# Patient Record
Sex: Female | Born: 1935 | Race: Black or African American | Hispanic: No | Smoking: Never smoker
Health system: Southern US, Community
[De-identification: ages and names within clinical notes are randomized; demographics above are authoritative.]

## PROBLEM LIST (undated history)

## (undated) DIAGNOSIS — I10 Essential (primary) hypertension: Secondary | ICD-10-CM

---

## 2004-11-10 ENCOUNTER — Ambulatory Visit: Payer: Self-pay | Admitting: Family Medicine

## 2005-02-11 ENCOUNTER — Encounter: Payer: Self-pay | Admitting: Physical Medicine & Rehabilitation

## 2005-03-03 ENCOUNTER — Encounter: Payer: Self-pay | Admitting: Physical Medicine & Rehabilitation

## 2005-03-31 ENCOUNTER — Encounter: Payer: Self-pay | Admitting: Physical Medicine & Rehabilitation

## 2006-04-01 ENCOUNTER — Ambulatory Visit: Payer: Self-pay | Admitting: Internal Medicine

## 2006-07-11 ENCOUNTER — Emergency Department: Payer: Self-pay | Admitting: Emergency Medicine

## 2006-07-11 ENCOUNTER — Other Ambulatory Visit: Payer: Self-pay

## 2006-07-20 ENCOUNTER — Ambulatory Visit: Payer: Self-pay | Admitting: Vascular Surgery

## 2006-12-29 ENCOUNTER — Ambulatory Visit: Payer: Self-pay | Admitting: Pain Medicine

## 2007-01-05 ENCOUNTER — Ambulatory Visit: Payer: Self-pay | Admitting: Pain Medicine

## 2007-09-07 ENCOUNTER — Ambulatory Visit: Payer: Self-pay | Admitting: Gastroenterology

## 2007-12-09 ENCOUNTER — Other Ambulatory Visit: Payer: Self-pay

## 2007-12-09 ENCOUNTER — Emergency Department: Payer: Self-pay | Admitting: Emergency Medicine

## 2008-02-20 ENCOUNTER — Other Ambulatory Visit: Payer: Self-pay

## 2008-02-20 ENCOUNTER — Emergency Department: Payer: Self-pay | Admitting: Emergency Medicine

## 2008-02-21 ENCOUNTER — Ambulatory Visit: Payer: Self-pay | Admitting: Family Medicine

## 2008-04-11 ENCOUNTER — Ambulatory Visit: Payer: Self-pay | Admitting: Pain Medicine

## 2008-05-01 ENCOUNTER — Other Ambulatory Visit: Payer: Self-pay

## 2008-05-01 ENCOUNTER — Ambulatory Visit: Payer: Self-pay | Admitting: Family Medicine

## 2008-05-02 ENCOUNTER — Ambulatory Visit: Payer: Self-pay | Admitting: Family Medicine

## 2008-09-04 ENCOUNTER — Ambulatory Visit: Payer: Self-pay | Admitting: Obstetrics and Gynecology

## 2009-04-23 ENCOUNTER — Ambulatory Visit: Payer: Self-pay | Admitting: Orthopedic Surgery

## 2009-10-04 ENCOUNTER — Emergency Department: Payer: Self-pay | Admitting: Emergency Medicine

## 2013-02-23 ENCOUNTER — Encounter: Payer: Self-pay | Admitting: Family Medicine

## 2013-03-03 ENCOUNTER — Encounter: Payer: Self-pay | Admitting: Family Medicine

## 2013-04-03 ENCOUNTER — Encounter: Payer: Self-pay | Admitting: Family Medicine

## 2014-03-09 ENCOUNTER — Emergency Department: Payer: Self-pay | Admitting: Emergency Medicine

## 2014-03-09 LAB — CBC WITH DIFFERENTIAL/PLATELET
Basophil #: 0.1 10*3/uL (ref 0.0–0.1)
Basophil %: 0.8 %
Eosinophil #: 0.2 10*3/uL (ref 0.0–0.7)
Eosinophil %: 1.6 %
HCT: 35.2 % (ref 35.0–47.0)
HGB: 11.8 g/dL — ABNORMAL LOW (ref 12.0–16.0)
LYMPHS ABS: 0.4 10*3/uL — AB (ref 1.0–3.6)
Lymphocyte %: 2.6 %
MCH: 26.2 pg (ref 26.0–34.0)
MCHC: 33.6 g/dL (ref 32.0–36.0)
MCV: 78 fL — ABNORMAL LOW (ref 80–100)
Monocyte #: 0.9 x10 3/mm (ref 0.2–0.9)
Monocyte %: 6.2 %
NEUTROS ABS: 12.9 10*3/uL — AB (ref 1.4–6.5)
Neutrophil %: 88.8 %
Platelet: 201 10*3/uL (ref 150–440)
RBC: 4.5 10*6/uL (ref 3.80–5.20)
RDW: 15.6 % — ABNORMAL HIGH (ref 11.5–14.5)
WBC: 14.5 10*3/uL — ABNORMAL HIGH (ref 3.6–11.0)

## 2014-03-09 LAB — URINALYSIS, COMPLETE
BACTERIA: NONE SEEN
Glucose,UR: NEGATIVE mg/dL (ref 0–75)
Hyaline Cast: 9
KETONE: NEGATIVE
Leukocyte Esterase: NEGATIVE
Nitrite: NEGATIVE
PH: 5 (ref 4.5–8.0)
Protein: 100
SPECIFIC GRAVITY: 1.023 (ref 1.003–1.030)
Squamous Epithelial: 4
WBC UR: 5 /HPF (ref 0–5)

## 2014-03-09 LAB — BASIC METABOLIC PANEL
Anion Gap: 9 (ref 7–16)
BUN: 26 mg/dL — AB (ref 7–18)
CALCIUM: 8.8 mg/dL (ref 8.5–10.1)
Chloride: 94 mmol/L — ABNORMAL LOW (ref 98–107)
Co2: 28 mmol/L (ref 21–32)
Creatinine: 1.17 mg/dL (ref 0.60–1.30)
EGFR (African American): 52 — ABNORMAL LOW
EGFR (Non-African Amer.): 45 — ABNORMAL LOW
Glucose: 118 mg/dL — ABNORMAL HIGH (ref 65–99)
OSMOLALITY: 269 (ref 275–301)
Potassium: 3.3 mmol/L — ABNORMAL LOW (ref 3.5–5.1)
Sodium: 131 mmol/L — ABNORMAL LOW (ref 136–145)

## 2014-03-09 LAB — PROTIME-INR
INR: 7.2
Prothrombin Time: 59.3 secs — ABNORMAL HIGH (ref 11.5–14.7)

## 2014-03-13 ENCOUNTER — Inpatient Hospital Stay: Payer: Self-pay | Admitting: Specialist

## 2014-03-13 LAB — BASIC METABOLIC PANEL
ANION GAP: 9 (ref 7–16)
BUN: 21 mg/dL — AB (ref 7–18)
Calcium, Total: 8.5 mg/dL (ref 8.5–10.1)
Chloride: 97 mmol/L — ABNORMAL LOW (ref 98–107)
Co2: 28 mmol/L (ref 21–32)
Creatinine: 0.8 mg/dL (ref 0.60–1.30)
EGFR (Non-African Amer.): >60
Glucose: 109 mg/dL — ABNORMAL HIGH (ref 65–99)
Osmolality: 272 (ref 275–301)
POTASSIUM: 3.5 mmol/L (ref 3.5–5.1)
Sodium: 134 mmol/L — ABNORMAL LOW (ref 136–145)

## 2014-03-13 LAB — CBC WITH DIFFERENTIAL/PLATELET
BASOS ABS: 0.1 10*3/uL (ref 0.0–0.1)
Basophil %: 0.3 %
EOS ABS: 0 10*3/uL (ref 0.0–0.7)
Eosinophil %: 0.2 %
HCT: 30.9 % — ABNORMAL LOW (ref 35.0–47.0)
HGB: 10 g/dL — ABNORMAL LOW (ref 12.0–16.0)
Lymphocyte #: 2.1 10*3/uL (ref 1.0–3.6)
Lymphocyte %: 8.5 %
MCH: 25.3 pg — ABNORMAL LOW (ref 26.0–34.0)
MCHC: 32.3 g/dL (ref 32.0–36.0)
MCV: 78 fL — AB (ref 80–100)
MONO ABS: 1.3 x10 3/mm — AB (ref 0.2–0.9)
Monocyte %: 5.3 %
NEUTROS ABS: 20.7 10*3/uL — AB (ref 1.4–6.5)
Neutrophil %: 85.7 %
Platelet: 272 10*3/uL (ref 150–440)
RBC: 3.96 10*6/uL (ref 3.80–5.20)
RDW: 15.6 % — ABNORMAL HIGH (ref 11.5–14.5)
WBC: 24.1 10*3/uL — ABNORMAL HIGH (ref 3.6–11.0)

## 2014-03-13 LAB — PROTIME-INR
INR: 5.9
PROTHROMBIN TIME: 51 s — AB (ref 11.5–14.7)

## 2014-03-13 LAB — HEPATIC FUNCTION PANEL A (ARMC)
ALBUMIN: 1.9 g/dL — AB (ref 3.4–5.0)
ALK PHOS: 402 U/L — AB
AST: 46 U/L — AB (ref 15–37)
BILIRUBIN DIRECT: 1.9 mg/dL — AB (ref 0.00–0.20)
Bilirubin,Total: 2.9 mg/dL — ABNORMAL HIGH (ref 0.2–1.0)
SGPT (ALT): 38 U/L
TOTAL PROTEIN: 7.5 g/dL (ref 6.4–8.2)

## 2014-03-13 LAB — URINALYSIS, COMPLETE
BILIRUBIN, UR: NEGATIVE
BLOOD: NEGATIVE
Glucose,UR: NEGATIVE mg/dL (ref 0–75)
Ketone: NEGATIVE
Leukocyte Esterase: NEGATIVE
NITRITE: NEGATIVE
PH: 6 (ref 4.5–8.0)
Protein: NEGATIVE
SPECIFIC GRAVITY: 1.017 (ref 1.003–1.030)
WBC UR: 3 /HPF (ref 0–5)

## 2014-03-13 LAB — TROPONIN I: Troponin-I: <0.02 ng/mL

## 2014-03-13 LAB — APTT: Activated PTT: 92.3 secs — ABNORMAL HIGH (ref 23.6–35.9)

## 2014-03-14 LAB — CBC WITH DIFFERENTIAL/PLATELET
BASOS PCT: 0.2 %
Basophil #: 0 10*3/uL (ref 0.0–0.1)
EOS ABS: 0.1 10*3/uL (ref 0.0–0.7)
EOS PCT: 0.5 %
HCT: 29.2 % — ABNORMAL LOW (ref 35.0–47.0)
HGB: 9.3 g/dL — ABNORMAL LOW (ref 12.0–16.0)
Lymphocyte #: 1.3 10*3/uL (ref 1.0–3.6)
Lymphocyte %: 7.1 %
MCH: 24.9 pg — ABNORMAL LOW (ref 26.0–34.0)
MCHC: 31.8 g/dL — AB (ref 32.0–36.0)
MCV: 78 fL — ABNORMAL LOW (ref 80–100)
MONO ABS: 1.2 x10 3/mm — AB (ref 0.2–0.9)
Monocyte %: 6.7 %
Neutrophil #: 15.2 10*3/uL — ABNORMAL HIGH (ref 1.4–6.5)
Neutrophil %: 85.5 %
PLATELETS: 285 10*3/uL (ref 150–440)
RBC: 3.74 10*6/uL — ABNORMAL LOW (ref 3.80–5.20)
RDW: 15.7 % — ABNORMAL HIGH (ref 11.5–14.5)
WBC: 17.7 10*3/uL — ABNORMAL HIGH (ref 3.6–11.0)

## 2014-03-14 LAB — COMPREHENSIVE METABOLIC PANEL
ALBUMIN: 1.7 g/dL — AB (ref 3.4–5.0)
Alkaline Phosphatase: 324 U/L — ABNORMAL HIGH
Anion Gap: 6 — ABNORMAL LOW (ref 7–16)
BILIRUBIN TOTAL: 2.2 mg/dL — AB (ref 0.2–1.0)
BUN: 16 mg/dL (ref 7–18)
CHLORIDE: 102 mmol/L (ref 98–107)
Calcium, Total: 8 mg/dL — ABNORMAL LOW (ref 8.5–10.1)
Co2: 28 mmol/L (ref 21–32)
Creatinine: 0.88 mg/dL (ref 0.60–1.30)
EGFR (African American): >60
Glucose: 99 mg/dL (ref 65–99)
Osmolality: 273 (ref 275–301)
POTASSIUM: 3 mmol/L — AB (ref 3.5–5.1)
SGOT(AST): 30 U/L (ref 15–37)
SGPT (ALT): 28 U/L
Sodium: 136 mmol/L (ref 136–145)
Total Protein: 6.5 g/dL (ref 6.4–8.2)

## 2014-03-14 LAB — LACTATE DEHYDROGENASE: LDH: 170 U/L (ref 81–246)

## 2014-03-14 LAB — PROTIME-INR
INR: 5.7
Prothrombin Time: 49.3 secs — ABNORMAL HIGH (ref 11.5–14.7)

## 2014-03-15 LAB — URINE CULTURE

## 2014-03-15 LAB — POTASSIUM: Potassium: 2.6 mmol/L — ABNORMAL LOW (ref 3.5–5.1)

## 2014-03-15 LAB — PROTIME-INR
INR: 2.3
PROTHROMBIN TIME: 24.4 s — AB (ref 11.5–14.7)

## 2014-03-16 LAB — PROTIME-INR
INR: 1.4
Prothrombin Time: 17.2 secs — ABNORMAL HIGH (ref 11.5–14.7)

## 2014-03-16 LAB — POTASSIUM: Potassium: 3.2 mmol/L — ABNORMAL LOW (ref 3.5–5.1)

## 2014-03-17 LAB — CBC WITH DIFFERENTIAL/PLATELET
BASOS ABS: 0.1 10*3/uL (ref 0.0–0.1)
BASOS PCT: 0.9 %
EOS ABS: 0.2 10*3/uL (ref 0.0–0.7)
EOS PCT: 2.4 %
HCT: 26.5 % — ABNORMAL LOW (ref 35.0–47.0)
HGB: 8.7 g/dL — ABNORMAL LOW (ref 12.0–16.0)
LYMPHS ABS: 1.1 10*3/uL (ref 1.0–3.6)
Lymphocyte %: 14.3 %
MCH: 25.7 pg — AB (ref 26.0–34.0)
MCHC: 33.1 g/dL (ref 32.0–36.0)
MCV: 78 fL — ABNORMAL LOW (ref 80–100)
Monocyte #: 0.7 x10 3/mm (ref 0.2–0.9)
Monocyte %: 9.7 %
NEUTROS ABS: 5.4 10*3/uL (ref 1.4–6.5)
Neutrophil %: 72.7 %
PLATELETS: 293 10*3/uL (ref 150–440)
RBC: 3.4 10*6/uL — AB (ref 3.80–5.20)
RDW: 15.9 % — ABNORMAL HIGH (ref 11.5–14.5)
WBC: 7.4 10*3/uL (ref 3.6–11.0)

## 2014-03-17 LAB — LIPASE, BLOOD: LIPASE: 4326 U/L — AB (ref 73–393)

## 2014-03-17 LAB — COMPREHENSIVE METABOLIC PANEL
ALT: 21 U/L
AST: 24 U/L (ref 15–37)
Albumin: 1.6 g/dL — ABNORMAL LOW (ref 3.4–5.0)
Alkaline Phosphatase: 234 U/L — ABNORMAL HIGH
Anion Gap: 8 (ref 7–16)
BILIRUBIN TOTAL: 1.4 mg/dL — AB (ref 0.2–1.0)
BUN: 4 mg/dL — ABNORMAL LOW (ref 7–18)
CALCIUM: 7.7 mg/dL — AB (ref 8.5–10.1)
CHLORIDE: 110 mmol/L — AB (ref 98–107)
Co2: 25 mmol/L (ref 21–32)
Creatinine: 0.82 mg/dL (ref 0.60–1.30)
EGFR (Non-African Amer.): >60
GLUCOSE: 106 mg/dL — AB (ref 65–99)
Osmolality: 282 (ref 275–301)
POTASSIUM: 3.5 mmol/L (ref 3.5–5.1)
Sodium: 143 mmol/L (ref 136–145)
Total Protein: 6.2 g/dL — ABNORMAL LOW (ref 6.4–8.2)

## 2014-03-18 LAB — COMPREHENSIVE METABOLIC PANEL
ALK PHOS: 212 U/L — AB
ANION GAP: 8 (ref 7–16)
AST: 23 U/L (ref 15–37)
Albumin: 1.8 g/dL — ABNORMAL LOW (ref 3.4–5.0)
BUN: 5 mg/dL — AB (ref 7–18)
Bilirubin,Total: 1.2 mg/dL — ABNORMAL HIGH (ref 0.2–1.0)
CHLORIDE: 109 mmol/L — AB (ref 98–107)
CO2: 25 mmol/L (ref 21–32)
Calcium, Total: 7.7 mg/dL — ABNORMAL LOW (ref 8.5–10.1)
Creatinine: 0.84 mg/dL (ref 0.60–1.30)
EGFR (African American): >60
GLUCOSE: 102 mg/dL — AB (ref 65–99)
Osmolality: 281 (ref 275–301)
Potassium: 3.6 mmol/L (ref 3.5–5.1)
SGPT (ALT): 18 U/L
SODIUM: 142 mmol/L (ref 136–145)
Total Protein: 6.4 g/dL (ref 6.4–8.2)

## 2014-03-18 LAB — CULTURE, BLOOD (SINGLE)

## 2014-03-18 LAB — LIPASE, BLOOD: LIPASE: 728 U/L — AB (ref 73–393)

## 2014-03-21 LAB — PATHOLOGY REPORT

## 2014-05-08 ENCOUNTER — Ambulatory Visit: Payer: Self-pay | Admitting: Gastroenterology

## 2014-05-23 ENCOUNTER — Ambulatory Visit: Payer: Self-pay | Admitting: Oncology

## 2014-06-06 ENCOUNTER — Ambulatory Visit: Payer: Self-pay | Admitting: Oncology

## 2014-07-02 ENCOUNTER — Ambulatory Visit: Payer: Self-pay | Admitting: Ophthalmology

## 2014-07-03 ENCOUNTER — Ambulatory Visit: Payer: Self-pay | Admitting: Oncology

## 2014-08-27 ENCOUNTER — Ambulatory Visit: Payer: Self-pay | Admitting: Ophthalmology

## 2014-11-24 NOTE — Op Note (Signed)
PATIENT NAME:  Toni Perry, Toni Perry MR#:  [id number] DATE OF BIRTH:  08-23-1930  DATE OF PROCEDURE:  03/18/2014  PREOPERATIVE DIAGNOSIS: Choledocholithiasis.   POSTOPERATIVE DIAGNOSIS: Choledocholithiasis.   PROCEDURE PERFORMED: Laparoscopic cholecystectomy.  ANESTHESIA: General endotracheal.  ESTIMATED BLOOD LOSS: 25 mL.   COMPLICATIONS: None.   SPECIMENS: Gallbladder.   INDICATION FOR SURGERY: Toni Perry is a pleasant 81 year old female with history of choledocholithiasis status post ERCP, stone extraction and stent placement. She was brought to the operating room for prophylactic cholecystectomy.   DETAILS OF PROCEDURE: Informed consent was obtained. Toni Perry was brought to the operating room suite. She was induced. Endotracheal tube was placed. General anesthesia was administered. The abdomen was prepped and draped in standard surgical fashion. A timeout was then performed correctly identifying the patient name, operative site and procedure to be performed. A supraumbilical incision was made. It was deepened down to the fascia. The fascia was incised. The peritoneum was entered. Two stay sutures were placed through the fasciotomy. Hassan trocar was placed in the abdomen. The abdomen was insufflated. The gallbladder was visualized. It was quite large. I then placed an 11 meter epigastric and 2 right subcostal 5 mm trocars.   The gallbladder was then lifted up. The cystic duct and was dissected out. Critical view was obtained. There were small branches that led to the cystic artery, but no obvious cystic artery was identified. The cystic duct was large and ligated with a firing of an Endo GIA stapler. The gallbladder was then taken off the gallbladder fossa with hook cautery. There were small branches which were feeding the blood supply of a very large gallbladder. These were clipped individually as they were encountered. The gallbladder was then taken off the gallbladder fossa with hook  cautery, brought out through an Endo Catch bag. The gallbladder fossa was then examined and made hemostatic. The abdomen was irrigated. The gallbladder fossa was hemostatic. The abdomen was then desufflated all trocars were taken out under direct visualization. The supraumbilical trocar site fascia was closed with a figure-of-eight 0 Vicryl; 4-0 Monocryl deep dermal sutures were used to close the skin. Steri-Strips, Telfa gauze and Tegaderm were used to complete the dressing.   The patient was then awoken, extubated and brought to the postanesthesia care unit. There were no immediate complications. Needle, sponge, and instrument counts were correct at the end of the procedure.    ____________________________ Si Raiderhristopher A. Jaylnn Ullery, MD cal:JT D: 03/18/2014 10:04:35 ET T: 03/18/2014 12:30:13 ET JOB#: [id number]  cc: Cristal Deerhristopher A. Dekayla Prestridge, MD, <Dictator> Jarvis NewcomerHRISTOPHER A Bertha Earwood MD ELECTRONICALLY SIGNED 03/26/2014 21:09

## 2014-11-24 NOTE — Consult Note (Signed)
Discussed ERCP with patient, will need GB out at some time.  Dr. Servando SnareWohl NP note noted.  Hopefully on for tomorrow if coagulation improved.  Electronic Signatures: Scot JunElliott, Robert T (MD)  (Signed on 13-Aug-15 16:52)  Authored  Last Updated: 13-Aug-15 16:52 by Scot JunElliott, Robert T (MD)

## 2014-11-24 NOTE — H&P (Signed)
PATIENT NAME:  Toni Perry, Toni Perry MR#:  [id number] DATE OF BIRTH:  1931/05/13  DATE OF ADMISSION:  03/13/2014  PRIMARY CARE PHYSICIAN: Dione Housekeeper, MD  CHIEF COMPLAINT: "My Coumadin level is going up."   HISTORY OF PRESENT ILLNESS: This is an 81 year old female who was recently in the ER for elevated Coumadin level, coming back in today for some abdominal pain and elevated Coumadin level. She complains of abdominal pain, light across the entire upper abdomen. No other radiation, not that severe in nature. Currently, she does not have any pain. In the ER, she was found to have an elevated white count of 24,000. Chest x-ray and urinalysis were negative. An ultrasound of the abdomen showed cholelithiasis and gallbladder sludge. No evidence of acute cholecystitis, biliary ductal dilation with sludge or stones within the common duct, consider ERCP. The patient's INR is up at 5.9, total bilirubin 2.9, direct bilirubin 1.9, alkaline phosphatase 402, ALT 38 and AST 46 in the ER. The patient is a poor historian, unable to give much history. Hospitalist services were contacted for further evaluation.   PAST MEDICAL HISTORY: PE, DVT, on Coumadin, gallstones, depression, gastroesophageal reflux disease and hypertension.   PAST SURGICAL HISTORY: Hip replacement, laminectomy, tonsillectomy.   ALLERGIES: No known drug allergies.   MEDICATIONS: As per prescription writer include aspirin 81 mg daily, calcium with vitamin D daily, Coumadin 1 mg has 4 mg and 5 mg alternating, felodipine 2.5 mg daily, fish oil 1000 mg 3 times a day, folic acid 800 mcg daily, hydrochlorothiazide 25 mg daily, Lexapro 20 mg daily, Lipitor 20 mg daily, metoprolol 12.5 mg twice a day, multivitamin 1 tablet daily, potassium chloride 20 mEq extended-release daily, Prevacid 30 mg daily, quinapril 20 mg daily, vitamin C 500 mg daily, vitamin D 400 international units daily.   SOCIAL HISTORY: No smoking. No alcohol. No drug use. Lives  alone. Used to be a Engineer, civil (consulting).   FAMILY HISTORY: Father: Unknown medical history. Mother died of some heart-related issue when the patient was 76 months old.   REVIEW OF SYSTEMS: CONSTITUTIONAL: No fever, chills or sweats. Positive for weight loss. Positive for weakness.  EYES: She does have cataracts and some poor vision.  EARS, NOSE, MOUTH AND THROAT: No hearing loss. No sore throat. Positive for sinus congestion.  CARDIOVASCULAR: No chest pain. No palpitations.  RESPIRATORY: No shortness of breath. Positive for cough. No sputum. No hemoptysis.  GASTROINTESTINAL: Positive for abdominal pain. No nausea. No vomiting. No diarrhea. No constipation. No bright red blood per rectum. No melena.  GENITOURINARY: No burning on urination. No hematuria.  MUSCULOSKELETAL: No joint pain or muscle pain.  INTEGUMENT: Easy bruising.  NEUROLOGIC: No fainting or blackouts.  PSYCHIATRIC: On medication for depression.  ENDOCRINE: No thyroid problems.  HEMATOLOGIC AND LYMPHATIC: History of anemia.   PHYSICAL EXAMINATION:  VITAL SIGNS: On presentation to the Emergency Room included a temperature of 98.5, pulse 57, respirations 20, blood pressure 124/67, pulse oximetry 97% on room air.  GENERAL: No respiratory distress.  EYES: Conjunctivae and lids normal. Pupils equal, round and reactive to light. Extraocular muscles intact. No nystagmus.  EARS, NOSE, MOUTH AND THROAT: Tympanic membranes: No erythema. Nasal mucosa: No erythema. Throat: No erythema, no exudate seen. Lips and gums: No lesions.  NECK: No JVD. No bruits. No lymphadenopathy. No thyromegaly. No thyroid nodules palpated.  LUNGS: Clear to auscultation. No use of accessory muscles to breathe. No rhonchi, rales or wheeze heard.  CARDIOVASCULAR: S1, S2 normal. No gallops, rubs or murmurs  heard. Carotid upstroke 2+ bilaterally. No bruits. Dorsalis pedis pulses 1+ bilaterally. Trace edema of the lower extremity.  ABDOMEN: Soft. Slight discomfort over the upper  abdomen. No organomegaly/splenomegaly. Normoactive bowel sounds. No masses felt.  LYMPHATIC: No lymph nodes in the neck.  MUSCULOSKELETAL: Trace edema. No clubbing. No cyanosis.  SKIN: No ulcers or lesions seen.  NEUROLOGIC: Cranial nerves II-XII grossly intact. Deep tendon reflexes 1+ bilateral lower extremity.  PSYCHIATRIC: The patient is alert, oriented to person and place.   LABORATORY AND RADIOLOGICAL DATA: Ultrasound of the abdomen shows cholelithiasis and gallbladder sludge. No evidence of acute cholecystitis. Biliary ductal dilation with sludge or stones within the common duct, consider ERCP. Urinalysis negative.  Chest x-ray: No active disease. White blood cell count 24.1, H and H 10.0 and 30.9, platelet count of 272. Glucose 109, BUN 21, creatinine 0.8, sodium 134, potassium 3.5, chloride 97, CO2 of 28, calcium 8.5. Troponin negative. INR 5.9, total bilirubin 2.9, direct 1.9, alkaline phosphatase 402, ALT 38, AST 46, albumin low at 1.9. EKG: Sinus bradycardia, 57 beats per minute.   ASSESSMENT AND PLAN:  1.  Possible cholangitis with leukocytosis, dilated biliary ducts with possible sludge or stones. Since the patient's INR is elevated, unable to do an ERCP at this time. We will order an MRCP and give empiric Zosyn. We will give a GI consultation, serial CMPs. We will give a dose of vitamin K for the coagulopathy.  2.  Coagulopathy with history of pulmonary embolism. We will give a dose of vitamin K. If the patient's Coumadin level bounces around, maybe a candidate for one of the newer agents upon discharge.  3.  Relative hypotension. We will give IV fluid hydration. Hold most of the blood pressure medications at this time except for the metoprolol.  4.  Depression. Continue Lexapro.  5.  Gastroesophageal reflux disease. We will put on Protonix while here.  6.  Malnutrition.  TIME SPENT ON ADMISSION: Fifty-five minutes.   CODE STATUS: The patient is a full code.     ____________________________ Herschell Dimesichard J. Renae GlossWieting, MD rjw:TT D: 03/13/2014 20:31:51 ET T: 03/13/2014 21:08:09 ET JOB#: [id number]  cc: Herschell Dimesichard J. Renae GlossWieting, MD, <Dictator> Dione HousekeeperMario Ernesto Olmedo, MD Salley ScarletICHARD J Roderic Lammert MD ELECTRONICALLY SIGNED 04/06/2014 15:05

## 2014-11-24 NOTE — Discharge Summary (Signed)
PATIENT NAME:  Toni GriceHEWLETT, Toni Perry MR#:  [id number] DATE OF BIRTH:  25-Feb-1931  DATE OF ADMISSION:  03/13/2014 DATE OF DISCHARGE:  03/19/2014  ADDENDUM  DISCHARGE DIAGNOSES: Bacteremia: The patient was noted to have positive blood cultures with E. coli on August 11th. The suspected source is likely a biliary source given her choledocholithiasis. She was empirically treated with IV Zosyn while in the hospital. Her repeat blood cultures have been negative. At this point, she is afebrile and hemodynamically stable. I am discharging her on some oral Ceftin for the negative blood culture for another week or so.  ____________________________ Rolly PancakeVivek Perry. Cherlynn KaiserSainani, MD vjs:sb D: 03/19/2014 16:26:50 ET T: 03/19/2014 16:33:40 ET JOB#: [id number]  cc: Rolly PancakeVivek Perry. Cherlynn KaiserSainani, MD, <Dictator> Houston SirenVIVEK Perry Nashonda Limberg MD ELECTRONICALLY SIGNED 03/20/2014 16:03

## 2014-11-24 NOTE — Consult Note (Signed)
Chief Complaint:  Subjective/Chief Complaint We were contacted to evaluate patient for possible ERCP with stone extraction & sphincterotomy.  Dr Elliott's notes reviewed.  Pt denies any abdominal pain, nausea, or vomiting.   VITAL SIGNS/ANCILLARY NOTES: **Vital Signs.:   13-Aug-15 08:30  Vital Signs Type Q 4hr  Temperature Temperature (F) 97.7  Celsius 36.5  Temperature Source oral  Pulse Pulse 52  Respirations Respirations 18  Systolic BP Systolic BP 403  Diastolic BP (mmHg) Diastolic BP (mmHg) 66  Mean BP 83  Pulse Ox % Pulse Ox % 96  Pulse Ox Activity Level  At rest  Oxygen Delivery Room Air/ 21 %   Brief Assessment:  GEN well developed, well nourished, no acute distress, A/Ox3   Cardiac Regular   Respiratory normal resp effort   Gastrointestinal details normal Soft  Nontender  Nondistended  No masses palpable  Bowel sounds normal  No rebound tenderness  No gaurding  No rigidity   EXTR negative cyanosis/clubbing, negative edema   Additional Physical Exam Skin: warm, dry HEENT: mild icterus   Lab Results: Hepatic:  12-Aug-15 04:49   Bilirubin, Total  2.2  Alkaline Phosphatase  324 (46-116 NOTE: New Reference Range 02/20/14)  SGPT (ALT) 28 (14-63 NOTE: New Reference Range 02/20/14)  SGOT (AST) 30  Total Protein, Serum 6.5  Albumin, Serum  1.7  Routine BB:  12-Aug-15 13:47   Fresh Frozen Plasma Unit 1 Transfused  Result(s) reported on 15 Mar 2014 at 06:56AM.  13-Aug-15 09:40   Fresh Frozen Plasma Unit 1 Ready (Result(s) reported on 15 Mar 2014 at 09:51AM.)  Routine Chem:  12-Aug-15 04:49   LDH, Serum 170 (Result(s) reported on 14 Mar 2014 at 06:07AM.)  Glucose, Serum 99  BUN 16  Creatinine (comp) 0.88  Sodium, Serum 136  Potassium, Serum  3.0  Chloride, Serum 102  CO2, Serum 28  Calcium (Total), Serum  8.0  Osmolality (calc) 273  eGFR (African American) >60  eGFR (Non-African American) >60 (eGFR values <62m/min/1.73 m2 may be an indication of  chronic kidney disease (CKD). Calculated eGFR is useful in patients with stable renal function. The eGFR calculation will not be reliable in acutely ill patients when serum creatinine is changing rapidly. It is not useful in  patients on dialysis. The eGFR calculation may not be applicable to patients at the low and high extremes of body sizes, pregnant women, and vegetarians.)  Anion Gap  6  Result Comment INR - RESULTS VERIFIED BY REPEAT TESTING.  - NOTIFIED OF CRITICAL VALUE  - HP TO DON SONGSEN_0 ,03/14/14  - READ-BACK PROCESS PERFORMED.  Result(s) reported on 14 Mar 2014 at 06:32AM.  Routine Coag:  12-Aug-15 04:49   Prothrombin  49.3  INR  5.7 (INR reference interval applies to patients on anticoagulant therapy. A single INR therapeutic range for coumarins is not optimal for all indications; however, the suggested range for most indications is 2.0 - 3.0. Exceptions to the INR Reference Range may include: Prosthetic heart valves, acute myocardial infarction, prevention of myocardial infarction, and combinations of aspirin and anticoagulant. The need for a higher or lower target INR must be assessed individually. Reference: The Pharmacology and Management of the Vitamin K  antagonists: the seventh ACCP Conference on Antithrombotic and Thrombolytic Therapy. CKVQQV.9563Sept:126 (3suppl): [id number] A HCT value >55% may artifactually increase the PT.  In one study,  the increase was an average of 25%. Reference:  "Effect on Routine and Special Coagulation Testing Values of Citrate Anticoagulant Adjustment in Patients with High  HCT Values." American Journal of Clinical Pathology 1165;790:383-338.)  13-Aug-15 05:32   Prothrombin  24.4  INR 2.3 (INR reference interval applies to patients on anticoagulant therapy. A single INR therapeutic range for coumarins is not optimal for all indications; however, the suggested range for most indications is 2.0 - 3.0. Exceptions to the INR  Reference Range may include: Prosthetic heart valves, acute myocardial infarction, prevention of myocardial infarction, and combinations of aspirin and anticoagulant. The need for a higher or lower target INR must be assessed individually. Reference: The Pharmacology and Management of the Vitamin K  antagonists: the seventh ACCP Conference on Antithrombotic and Thrombolytic Therapy. VANVB.1660 Sept:126 (3suppl): [id number]. A HCT value >55% may artifactually increase the PT.  In one study,  the increase was an average of 25%. Reference:  "Effect on Routine and Special Coagulation Testing Values of Citrate Anticoagulant Adjustment in Patients with High HCT Values." American Journal of Clinical Pathology 2006;126:400-405.)  Routine Hem:  12-Aug-15 04:49   WBC (CBC)  17.7  RBC (CBC)  3.74  Hemoglobin (CBC)  9.3  Hematocrit (CBC)  29.2  Platelet Count (CBC) 285  MCV  78  MCH  24.9  MCHC  31.8  RDW  15.7  Neutrophil % 85.5  Lymphocyte % 7.1  Monocyte % 6.7  Eosinophil % 0.5  Basophil % 0.2  Neutrophil #  15.2  Lymphocyte # 1.3  Monocyte #  1.2  Eosinophil # 0.1  Basophil # 0.0 (Result(s) reported on 14 Mar 2014 at 05:54AM.)   Assessment/Plan:  Assessment/Plan:  Assessment Choledocholithiasis:  Appreciate your referral for ERCP with stone extraction & sphincterotomy with Dr Allen Norris.  Pt has multiple distal choledocholithiasis with diffuse biliary dilation & cholelithiasis.  Discussed risks/benefits of procedure which include but are not limited to pancreatitis, bleeding, infection, perforation & drug reaction.  Patient agrees with this plan & consent will be obtained.  Her INR should be around 1.5 prior to procedure to minimize risk of bleeding.  I discussed with Dr Posey Pronto who has order more FFP & Vit K today with recheck in AM.   Coagulopathy: INR 2.3, per attending as above Hypokalemia: K 3.0, Attending to replete with recheck in AM   Plan 1) Agree with FFP & Vit K 2) Recheck INR &  BMP in AM 3) K repletion per attending 4) Surgery recommends ultimate cholecystectomy 5) NPO after MN 6) ERCP tomorrow with Dr Allen Norris if INR 1.5 or less Please call if you have any questions or concerns   Electronic Signatures: Andria Meuse (NP)  (Signed 13-Aug-15 10:14)  Authored: Chief Complaint, VITAL SIGNS/ANCILLARY NOTES, Brief Assessment, Lab Results, Assessment/Plan   Last Updated: 13-Aug-15 10:14 by Andria Meuse (NP)

## 2014-11-24 NOTE — Consult Note (Signed)
PATIENT NAME:  Toni Perry, Toni Perry MR#:  [id number] DATE OF BIRTH:  [birth year]  DATE OF CONSULTATION:  03/14/2014  REFERRING PHYSICIAN:   CONSULTING PHYSICIAN:  Cristal Deerhristopher A. Damonte Frieson, MD  REASON FOR CONSULTATION: Abdominal pain, leukocytosis, elevated bilirubin and common bile duct dilatation and stones.   HISTORY OF PRESENT ILLNESS: Toni Perry is a pleasant 81 year old female who presented initially for elevated INR who came in with abdominal pain. She says that her pain is across her upper abdomen. This has been going on now for approximately 3 months, does not radiate, not severe. She is currently without pain and was without pain at presentation. She was noted to have an elevated white cell count and ultrasound showed cholelithiasis and sludge. Her INR was noted to be 5.9, her total bilirubin was 2.9 and direct bilirubin was 1.9, her alkaline phosphatase was 402. She had an MRCP today which showed a dilated common bile duct with multiple common bile duct stones. She is to undergo an ERCP. Otherwise, no fevers, chills, night sweats, shortness of breath, cough, chest pain, nausea, vomiting, diarrhea, constipation, dysuria or hematuria.   PAST MEDICAL HISTORY:  1.  History of PE and DVT, on Coumadin.  2.  History of gallstones, history of common bile duct dilatation, status post ERCP in 2009.  3.  History of depression. 4.  History of GERD. 5.  History of hypertension. 6.  History of hip replacement. 7.  History of lumpectomy.  8.  History of tonsillectomy.   HOME MEDICATIONS:  1.  Aspirin.  2.  Vitamin D.  3.  Coumadin.  4.  Felodipine. 5.  Fish oil.  6.  Folate.  7.  Hydrochlorothiazide. 8.  Lexapro. 9.  Lipitor. 10.  Metoprolol.  11.  Multivitamin.  12.  Potassium chloride.  13.  Prevacid.  14.  Quinapril. 15.  Vitamin C.  16.  Vitamin D.   ALLERGIES: No known drug allergies.   SOCIAL HISTORY: Denies alcohol and tobacco. A history of being a Engineer, civil (consulting)nurse.  FAMILY HISTORY:  Mother died of heart-related issue.   REVIEW OF SYSTEMS: A 12-point review of systems was obtained. Pertinent positives and negatives as above.   PHYSICAL EXAMINATION:  VITAL SIGNS: Current temperature 97.8, pulse 55, blood pressure 121/65, respirations 18, and 95% on room air.  GENERAL: No acute distress. Alert and oriented x 3.  HEAD: Normocephalic, atraumatic.  EYES: No scleral icterus. No conjunctivitis.  FACE: No obvious facial trauma. Normal external ears, normal external nose CHEST: Lungs clear to auscultation, moving air well. HEART: Regular rate and rhythm. No murmurs, rubs or gallops. ABDOMEN: Soft, nontender, nondistended.  EXTREMITIES: Moves all extremities well. Strength 5/5.  NEUROLOGIC: Cranial nerves II-XII grossly intact.   LABORATORY, DIAGNOSTIC AND RADIOLOGICAL DATA: Currently, a white cell count of 17.7, hemoglobin 9.3, hematocrit 29.2, and platelets 285 Bilirubin 2.2, alkaline phosphatase 324, BNP has a potassium of 3.0, bicarb of 28, BUN of 16, creatinine of 0.88.   Ultrasound shows cholelithiasis and sludge, no evidence of acute cholecystitis.   MRI shows contrast in intrahepatic and extrahepatic common bile duct measuring 18 mm, stones in the distal common bile duct measuring the largest 1.6 cm.   ASSESSMENT AND PLAN: Toni Perry is a pleasant 81 year old female who looks like she has choledocholithiasis. She has an elevated bilirubin, elevated white cell count, which is suggestive of choledocholithiasis and potentially cholangitis. However, she is afebrile and nontender. Agree with ERCP. Will recommend cholecystectomy after bile duct is cleared to prevent recurrence.  ____________________________ Si Raider Dally Oshel, MD cal:TT D: 03/14/2014 17:34:19 ET T: 03/14/2014 17:58:57 ET JOB#: [id number]  cc: Cristal Deer A. Samaria Anes, MD, <Dictator> Jarvis Newcomer MD ELECTRONICALLY SIGNED 03/26/2014 21:08

## 2014-11-24 NOTE — Consult Note (Signed)
Pt lipase down greatly from 4000 to 728.  She has gone to surgery.  I spoke with daughter and the patient will need stent removed in 2 months with Dr. Servando SnareWohl.  Electronic Signatures: Scot JunElliott, Robert T (MD)  (Signed on 16-Aug-15 09:26)  Authored  Last Updated: 16-Aug-15 09:26 by Scot JunElliott, Robert T (MD)

## 2014-11-24 NOTE — Consult Note (Signed)
Pt feeling better, denies abd pain and on exam is not tender.  I discussed with patient and daughter that she will need a repeat ERCP in 2 months.  Holding on surgery to see when lipase comes down.  Given her lack of pain and tenderness this should be soon.  No new recommendations.  Electronic Signatures: Scot JunElliott, Robert T (MD)  (Signed on 15-Aug-15 09:57)  Authored  Last Updated: 15-Aug-15 09:57 by Scot JunElliott, Robert T (MD)

## 2014-11-24 NOTE — Consult Note (Signed)
PATIENT NAME:  Toni Perry, Toni Perry MR#:  [id number] DATE OF BIRTH:  13-May-1931  DATE OF CONSULTATION:  03/14/2014  REFERRING PHYSICIAN:   CONSULTING PHYSICIAN:  Scot Jun, MD  HISTORY OF PRESENT ILLNESS:  The patient is an 81 year old black female who was admitted to the hospital. She was seen in the ER and had a white count of 24,000, had an INR of 5.9, a direct bilirubin of 1.9. Ultrasound showed dilated ducts and gallstones and sludge.  She was admitted to the hospital. I was asked to see her in consultation because of her biliary problems.   PAST MEDICAL HISTORY: Pulmonary embolus, DVT, known gallstones, depression, GERD, and hypertension.   PAST SURGICAL HISTORY: Hip replacement, laminectomy, and tonsillectomy.   ALLERGIES: NO KNOWN DRUG ALLERGIES.   MEDICATIONS: From the chart, aspirin 81 mg a day, Coumadin 4 mg alternating with 5 mg daily, felodipine 2.5 mg daily, fish oil 1000 mg 3 times a day, folic acid 800 mcg a day, hydrochlorothiazide 25 mg a day, Lexapro 20 mg a day, Lipitor 20 mg a day, metoprolol 12.5 mg twice a day, multivitamin once a day, potassium chloride 20 mEq daily, Prevacid 30 mg daily, quinapril 20 mg daily, vitamin C 500 mg daily, and vitamin D 400 units daily.   SOCIAL HISTORY: The patient lives alone.  Does not smoke. Does not drink. Used to be a Engineer, civil (consulting).   FAMILY HISTORY: Father died of unknown causes. Mother died when the patient was 41 years old.   REVIEW OF SYSTEMS: She denies any hearing loss. No chest pains. No skipping heartbeats. No shortness of breath. She does have abdominal pain. No nausea or vomiting. No diarrhea. No rectal bleeding. No pain or burning with urination. No fainting or blackout spells.   PHYSICAL EXAMINATION:  GENERAL: Elderly black female in no acute distress.  VITAL SIGNS: Temperature 97.6, pulse 60, respirations 18, blood pressure 136/74, oxygen saturation 95% on room air.  HEENT: Sclerae not intensely icteric. Conjunctivae negative.  Tongue is pink.  Head is atraumatic. Trachea is in the midline.  CHEST: Clear on the right,  somewhat decreased breath sounds in the left.  HEART: No murmurs or gallops I can hear.  ABDOMEN: Soft. Bowel sounds present. No palpable hepatosplenomegaly.  EXTREMITIES: Trace edema. The patient moves all four extremities under her control responds appropriately to questions.  LABORATORY DATA: Ultrasound of the abdomen shows gallstones and gallbladder sludge, biliary ductal dilatation, possible stones.  Urinalysis unremarkable. White blood count 24,000, hemoglobin 10, platelet count 272,000, glucose 109, BUN 21, creatinine 0.8, sodium 134, potassium 3.5, chloride 97, CO2 28, calcium 8.5. Troponin is negative. INR 5.9, total bilirubin 2.9, direct 1.9, alkaline phosphatase 402, ALT 38, AST 46, albumin 1.9. EKG sinus bradycardia, 58 beats a minute. MRCP was done and it showed definite common duct dilatation, dilatation of intrahepatic ducts and stones in the common bile duct.   ASSESSMENT: Stones in the common bile duct with jaundice, elevated white count. The patient needs the following.  1.  IV antibiotics, which you have started.  2.  Correct elevated pro time as soon as possible because of the fact she will need an ERCP with sphincterotomy and stone removal as soon as possible.  Her elevated white count may be indicative of beginning of cholangitis.   It will be, of course, necessary to correct her over anticoagulation before this can be done. I have spoken to Dr. Allena Katz about IV vitamin K and infusion of 1 unit of plasma with  repeat laboratories to be drawn in the morning. Dr. Servando SnareWohl will be contacted about this case. He actually did an ERCP on her in 2009 and, since Dr. Bluford KaufmannOh is out of town, he will be the only one around that could possibly do the ERCP. I will follow with you.   ____________________________ Scot Junobert T. Elliott, MD rte:TT D: 03/14/2014 13:53:35 ET T: 03/14/2014 14:23:54  ET JOB#: [id number]  cc: Scot Junobert T. Elliott, MD, <Dictator> Dione HousekeeperMario Ernesto Olmedo, MD Midge Miniumarren Wohl, MD Scot JunOBERT T ELLIOTT MD ELECTRONICALLY SIGNED 04/01/2014 12:41

## 2014-11-24 NOTE — Discharge Summary (Signed)
PATIENT NAME:  Toni GriceHEWLETT, Toni Perry MR#:  [id number] DATE OF BIRTH:  15-May-1931  DATE OF ADMISSION:  03/13/2014 DATE OF DISCHARGE:  03/19/2014  For a detailed note, please see the history and physical done on admission by Dr. Alford Highlandichard Wieting.   DIAGNOSES AT DISCHARGE: Choledocholithiasis, status post endoscopic retrograde cholangiopancreatography with sphincterotomy and stent placement, status post laparoscopic cholecystectomy. Hypertension. Depression. Gastroesophageal reflux disease. Hyperlipidemia.   DIET: The patient is being discharged on a low-sodium, low-fat diet.   ACTIVITY: As tolerated.   FOLLOW-UP: Dr. Loura BackMark Byrd and Dr. Rolin BarryMario Olmedo, in the next 1-2 weeks.   DISCHARGE MEDICATIONS: Vitamin D 4000 international units daily; felodipine extended release 2.5 mg daily; Lipitor 20 mg daily; Prevacid 30 mg daily; hydrochlorothiazide 25 mg daily; calcium/vitamin D 1 tablet daily; folic acid 800 mcg daily; vitamin C 500  mg daily; fish oil 1000 mg t.i.d.; Omeprazole 20 mg daily; Coumadin 1 mg alternating between 4-5 mg per day; aspirin 81 mg daily; metoprolol tartrate 12.5 mg  b.i.d.; Lexapro 20 mg daily; multivitamin daily; potassium 20 mEq daily; Tylenol/hydrocodone 5/325 one tab q. 6 hours as needed.   CONSULTANTS: During the hospital course are Dr. Midge Miniumarren Wohl from gastroenterology, Dr. Juliann PulseLundquist, and Dr. Natale LayMark Bird from general surgery.   LABORATORY DATA: Pertinent studies done during the hospital course are as follows: A chest x-ray done on admission showing stable examination; no acute disease.   An ultrasound of the abdomen was done showing cholelithiasis and gallbladder sludge, biliary ductal dilatation and sludge or stones within the common bile duct. Consider ERCP.  An MRCP done on August 12 showing diffuse  biliary dilatation due to  choledocholithiasis with largest stone in the distal common bile duct measuring approximately 18 mm. Distended gallbladder containing sludge and  cholelithiasis.   HOSPITAL COURSE: This is an 81 year old female with medical problems as mentioned above, presented to the hospital on 03/13/2014, and noted to have an elevated INR. Incidentally, the patient was noted to have abdominal pain; also abnormal LFTs, and ultrasound findings consistent with choledocholithiasis.   PROBLEM LIST: 1. Choledocholithiasis. The patient presented to the hospital with abdominal pain and abnormal LFTs. Her ultrasound showed common bile duct stone, and biliary sludge, and gallbladder wall sludge. A gastroenterology consult was obtained. The patient was seen by Dr. Midge Miniumarren Wohl. He ended up performing an ERCP on the patient August 14. The patient tolerated the procedure well, underwent sphincterotomy and temporary stent placement. Post ERCP, patient's abdominal pain and clinical symptoms have improved. Her LFTs have also significantly improved and her bilirubins have come down. Since patient was also noted to have gallbladder wall sludge, a surgical consult was obtained. The patient also underwent status post laparoscopic cholecystectomy and is postoperative day #1 today. Post laparoscopic cholecystectomy, the patient is tolerating p.o. well. Her abdominal pain is controlled with just some oral medications. She is, therefore, being discharged home with close follow-up with surgery and GI as an outpatient.  2. Pancreatitis. The patient did develop some post- ERCP pancreatitis as her lipase was elevated the day after ERCP. This improved with supportive care with IV fluids, pain control, and her lipase has come down nicely.  3. Coagulopathy. The patient did have an elevated INR when she presented to the hospital. She is on Coumadin due to history of pulmonary embolism. She received some vitamin K and FFP to reverse her INR so she could have an  ERCP and laparoscopic cholecystectomy. She will resume her Coumadin now given her history of PE.  She likely needs to discuss with her  primary care physician if she could come off the Coumadin, depending on how long she has been treated for her pulmonary embolus.  4. Hypertension. The patient remained hemodynamically stable. She will continue her metoprolol.  5. Depression. The patient was maintained on Lexapro. She will resume that. 6. Gastroesophageal reflux disease. The patient was maintained on Protonix. She will also resume that. 7. Hypokalemia. This has since then been supplemented and now resolved.   The patient was seen by physical therapy who thought she would benefit from home health PT services, which is being arranged for her prior to discharge.   CODE STATUS: The patient is a full code.   TIME SPENT ON DISCHARGE: 40 minutes    ____________________________ Rolly Pancake. Cherlynn Kaiser, MD vjs:ls D: 03/19/2014 16:24:23 ET T: 03/19/2014 17:55:47 ET JOB#: [id number]  cc: Rolly Pancake. Cherlynn Kaiser, MD, <Dictator> Mark A. Egbert Garibaldi, MD Dione Housekeeper, MD  Houston Siren MD ELECTRONICALLY SIGNED 03/20/2014 16:06

## 2016-03-12 ENCOUNTER — Other Ambulatory Visit: Payer: Self-pay | Admitting: Family Medicine

## 2016-03-12 DIAGNOSIS — Z780 Asymptomatic menopausal state: Secondary | ICD-10-CM

## 2016-09-08 ENCOUNTER — Emergency Department: Payer: Medicare HMO

## 2016-09-08 ENCOUNTER — Emergency Department
Admission: EM | Admit: 2016-09-08 | Discharge: 2016-09-08 | Disposition: A | Discharge status: Home / Self Care | Payer: Medicare HMO | Attending: Emergency Medicine | Admitting: Emergency Medicine

## 2016-09-08 ENCOUNTER — Encounter: Payer: Self-pay | Admitting: *Deleted

## 2016-09-08 DIAGNOSIS — W0110XA Fall on same level from slipping, tripping and stumbling with subsequent striking against unspecified object, initial encounter: Secondary | ICD-10-CM | POA: Diagnosis not present

## 2016-09-08 DIAGNOSIS — Y939 Activity, unspecified: Secondary | ICD-10-CM | POA: Insufficient documentation

## 2016-09-08 DIAGNOSIS — Y999 Unspecified external cause status: Secondary | ICD-10-CM | POA: Insufficient documentation

## 2016-09-08 DIAGNOSIS — Y92009 Unspecified place in unspecified non-institutional (private) residence as the place of occurrence of the external cause: Secondary | ICD-10-CM | POA: Insufficient documentation

## 2016-09-08 DIAGNOSIS — I10 Essential (primary) hypertension: Secondary | ICD-10-CM | POA: Insufficient documentation

## 2016-09-08 DIAGNOSIS — M542 Cervicalgia: Secondary | ICD-10-CM | POA: Diagnosis not present

## 2016-09-08 DIAGNOSIS — R51 Headache: Secondary | ICD-10-CM | POA: Diagnosis not present

## 2016-09-08 DIAGNOSIS — S0990XA Unspecified injury of head, initial encounter: Secondary | ICD-10-CM | POA: Diagnosis present

## 2016-09-08 DIAGNOSIS — W19XXXA Unspecified fall, initial encounter: Secondary | ICD-10-CM

## 2016-09-08 HISTORY — DX: Essential (primary) hypertension: I10

## 2016-09-08 NOTE — ED Notes (Signed)
c-collar applied  

## 2016-09-08 NOTE — ED Provider Notes (Signed)
[facility] Emergency Department Provider Note   ____________________________________________   I have reviewed the triage vital signs and the nursing notes.   HISTORY  Chief Complaint Fall   History limited by: Not Limited   HPI Toni Perry is a 83 y.o. female who presents to the emergency department today after a mechanical fall. Patient states that she tripped on a throw rug when she was reentering her house. Its threw her forward. She states she hit her neck and her head. When she first got on the ground she denies any loss of consciousness but states she did feel like she was having a hard time breathing. The time of my exam she states she feels much better. She denies any upper or lower extremity injury. She denies any recent illness.  Past Medical History:  Diagnosis Date  . Hypertension     There are no active problems to display for this patient.   No past surgical history on file.  Prior to Admission medications   Not on File    Allergies Patient has no known allergies.  No family history on file.  Social History Social History  Substance Use Topics  . Smoking status: Never Smoker  . Smokeless tobacco: Never Used  . Alcohol use No    Review of Systems  Constitutional: Negative for fever. Cardiovascular: Negative for chest pain. Respiratory: Negative for shortness of breath. Gastrointestinal: Negative for abdominal pain, vomiting and diarrhea. Genitourinary: Negative for dysuria. Musculoskeletal: Positive for neck pain. Neurological: Negative for headaches, focal weakness or numbness.  10-point ROS otherwise negative.  ____________________________________________   PHYSICAL EXAM:  VITAL SIGNS: ED Triage Vitals  Enc Vitals Group     BP 09/08/16 2128 (!) 155/59     Pulse Rate 09/08/16 2128 (!) 55     Resp 09/08/16 2128 14     Temp 09/08/16 2128 98 F (36.7 C)     Temp Source 09/08/16 2128 Oral     SpO2 09/08/16  2128 99 %     Weight 09/08/16 2131 160 lb (72.6 kg)     Height 09/08/16 2131 5' 5.5" (1.664 m)   Constitutional: Alert and oriented. Well appearing and in no distress. Eyes: Conjunctivae are normal. Normal extraocular movements. ENT   Head: Normocephalic and atraumatic.   Nose: No congestion/rhinnorhea.   Mouth/Throat: Mucous membranes are moist.   Neck: No stridor. Hematological/Lymphatic/Immunilogical: No cervical lymphadenopathy. Cardiovascular: Normal rate, regular rhythm.  No murmurs, rubs, or gallops.  Respiratory: Normal respiratory effort without tachypnea nor retractions. Breath sounds are clear and equal bilaterally. No wheezes/rales/rhonchi. Gastrointestinal: Soft and non tender. No rebound. No guarding.  Genitourinary: Deferred Musculoskeletal: Normal range of motion in all extremities. No lower extremity edema. Neurologic:  Normal speech and language. No gross focal neurologic deficits are appreciated.  Skin:  Skin is warm, dry and intact. No rash noted. Psychiatric: Mood and affect are normal. Speech and behavior are normal. Patient exhibits appropriate insight and judgment.  ____________________________________________    LABS (pertinent positives/negatives)  Labs Reviewed - No data to display   ____________________________________________   EKG  I, Phineas SemenGraydon Avian Greenawalt, attending physician, personally viewed and interpreted this EKG  EKG Time: 2131 Rate: 54 Rhythm: sinus bradycardia Axis: normal Intervals: qtc 448 QRS: narrow ST changes: no st elevation Impression: abnormal ekg   ____________________________________________    RADIOLOGY  CT cervical spine/head IMPRESSION:  Chronic small vessel ischemic disease of periventricular white  matter. Cerebral atrophy. No acute intracranial abnormality.  Cervical spondylosis without acute traumatic fracture or  subluxation.       ____________________________________________   PROCEDURES  Procedures  ____________________________________________   INITIAL IMPRESSION / ASSESSMENT AND PLAN / ED COURSE  Pertinent labs & imaging results that were available during my care of the patient were reviewed by me and considered in my medical decision making (see chart for details).  Patient presented to the emergency department today complaining of head and neck pain after mechanical fall. CT head and neck were negative. Patient did have a mechanical falls not complaining of any other pain or symptoms. Will plan on discharging.  ____________________________________________   FINAL CLINICAL IMPRESSION(S) / ED DIAGNOSES  Final diagnoses:  Fall, initial encounter     Note: This dictation was prepared with Dragon dictation. Any transcriptional errors that result from this process are unintentional     Phineas Semen, MD 09/08/16 2253

## 2016-09-08 NOTE — Discharge Instructions (Signed)
Please seek medical attention for any high fevers, chest pain, shortness of breath, change in behavior, persistent vomiting, bloody stool or any other new or concerning symptoms.  

## 2016-09-08 NOTE — ED Notes (Signed)
Family at bedside. 

## 2016-09-08 NOTE — ED Triage Notes (Signed)
Per EMS report, Patient tripped over a rug at home and c/o neck and head pain. Patient states she hit her throat. EMS states patient was bradycardic upon their arrival, between 38-51. Patient is alert and oriented x4 and denies LOC. EMS reports patient was ambulatory at scene and was at baseline walking with a cane.

## 2017-03-01 ENCOUNTER — Encounter: Payer: Self-pay | Admitting: Emergency Medicine

## 2017-03-01 ENCOUNTER — Emergency Department: Payer: Medicare HMO

## 2017-03-01 DIAGNOSIS — I10 Essential (primary) hypertension: Secondary | ICD-10-CM | POA: Diagnosis not present

## 2017-03-01 DIAGNOSIS — R079 Chest pain, unspecified: Secondary | ICD-10-CM | POA: Insufficient documentation

## 2017-03-01 LAB — CBC
HEMATOCRIT: 36 % (ref 35.0–47.0)
Hemoglobin: 12.1 g/dL (ref 12.0–16.0)
MCH: 26.9 pg (ref 26.0–34.0)
MCHC: 33.7 g/dL (ref 32.0–36.0)
MCV: 79.7 fL — AB (ref 80.0–100.0)
Platelets: 244 10*3/uL (ref 150–440)
RBC: 4.52 MIL/uL (ref 3.80–5.20)
RDW: 14.4 % (ref 11.5–14.5)
WBC: 6.4 10*3/uL (ref 3.6–11.0)

## 2017-03-01 NOTE — ED Triage Notes (Signed)
Pt reports brief episode of left sided CP radiating into back; denies hx of same; BP 209/106; denies any accomp symptoms; denies symptoms at present

## 2017-03-02 ENCOUNTER — Emergency Department
Admission: EM | Admit: 2017-03-02 | Discharge: 2017-03-02 | Disposition: A | Discharge status: Home / Self Care | Payer: Medicare HMO | Attending: Emergency Medicine | Admitting: Emergency Medicine

## 2017-03-02 DIAGNOSIS — R079 Chest pain, unspecified: Secondary | ICD-10-CM | POA: Diagnosis not present

## 2017-03-02 DIAGNOSIS — I10 Essential (primary) hypertension: Secondary | ICD-10-CM

## 2017-03-02 LAB — BASIC METABOLIC PANEL
Anion gap: 9 (ref 5–15)
BUN: 12 mg/dL (ref 6–20)
CHLORIDE: 104 mmol/L (ref 101–111)
CO2: 26 mmol/L (ref 22–32)
CREATININE: 0.74 mg/dL (ref 0.44–1.00)
Calcium: 8.7 mg/dL — ABNORMAL LOW (ref 8.9–10.3)
GFR calc Af Amer: >60 mL/min (ref >60)
GFR calc non Af Amer: >60 mL/min (ref >60)
GLUCOSE: 102 mg/dL — AB (ref 65–99)
Potassium: 3.3 mmol/L — ABNORMAL LOW (ref 3.5–5.1)
SODIUM: 139 mmol/L (ref 135–145)

## 2017-03-02 LAB — TROPONIN I
Troponin I: <0.03 ng/mL (ref <0.03)
Troponin I: <0.03 ng/mL (ref <0.03)

## 2017-03-02 MED ORDER — HYDRALAZINE HCL 20 MG/ML IJ SOLN
10.0000 mg | Freq: Once | INTRAMUSCULAR | Status: AC
  Administered 2017-03-02: 10 mg via INTRAVENOUS
  Filled 2017-03-02: qty 1

## 2017-03-02 MED ORDER — METOPROLOL TARTRATE 25 MG PO TABS
12.5000 mg | ORAL_TABLET | Freq: Once | ORAL | Status: AC
  Administered 2017-03-02: 12.5 mg via ORAL
  Filled 2017-03-02: qty 1

## 2017-03-02 NOTE — ED Notes (Signed)
Pt states she has high blood pressure and her heart started beating irregular.  She had chest pain. She states that this has never happened before

## 2017-03-02 NOTE — ED Provider Notes (Signed)
[facility] Emergency Department Provider Note  ____________________________________________   First MD Initiated Contact with Patient 03/02/17 [phone]     (approximate)  I have reviewed the triage vital signs and the nursing notes.   HISTORY  Chief Complaint Chest Pain    HPI Toni Perry is a 83 y.o. female who comes into the hospital today with elevated blood pressure and chest pain. The patient reports that she noticed this around 10:15. She does not remember exactly what she was doing when she had the symptoms when she called a family member. The patient reports that her blood pressure was about 209 over the 100s. She had some left upper chest pain. She denies any other symptoms and specifically denies any shortness of breath, dizziness, lightheadedness, sweats, nausea or vomiting. She reports that she checked her heart beating that seemed fast. She reports that at the time it was really painful but her pain is gone right now. The patient has never had this in the past. She states that she has not taken her evening medications because she takes them at bedtime. The patient decided to come into the hospital today for evaluation.   Past Medical History:  Diagnosis Date  . Hypertension     There are no active problems to display for this patient.   History reviewed. No pertinent surgical history.  Prior to Admission medications   Not on File    Allergies Patient has no known allergies.  No family history on file.  Social History Social History  Substance Use Topics  . Smoking status: Never Smoker  . Smokeless tobacco: Never Used  . Alcohol use No    Review of Systems  Constitutional: No fever/chills Eyes: No visual changes. ENT: No sore throat. Cardiovascular: Denies chest pain. Respiratory: Denies shortness of breath. Gastrointestinal: No abdominal pain.  No nausea, no vomiting.  No diarrhea.  No constipation. Genitourinary:  Negative for dysuria. Musculoskeletal: Negative for back pain. Skin: Negative for rash. Neurological: Negative for headaches, focal weakness or numbness.   ____________________________________________   PHYSICAL EXAM:  VITAL SIGNS: ED Triage Vitals  Enc Vitals Group     BP 03/01/17 2338 (!) 197/78     Pulse Rate 03/01/17 2338 65     Resp 03/01/17 2338 18     Temp 03/01/17 2338 98 F (36.7 C)     Temp src --      SpO2 03/01/17 2338 100 %     Weight 03/01/17 2334 154 lb (69.9 kg)     Height 03/01/17 2334 5\' 5"  (1.651 m)     Head Circumference --      Peak Flow --      Pain Score --      Pain Loc --      Pain Edu? --      Excl. in GC? --     Constitutional: Alert and oriented. Well appearing and in no acute distress. Eyes: Conjunctivae are normal. PERRL. EOMI. Head: Atraumatic. Nose: No congestion/rhinnorhea. Mouth/Throat: Mucous membranes are moist.  Oropharynx non-erythematous. Cardiovascular: Normal rate, regular rhythm. Grossly normal heart sounds.  Good peripheral circulation. Respiratory: Normal respiratory effort.  No retractions. Lungs CTAB. Gastrointestinal: Soft and nontender. No distention.  Musculoskeletal: No lower extremity tenderness nor edema.  Neurologic:  Normal speech and language.  Skin:  Skin is warm, dry and intact.  Psychiatric: Mood and affect are normal.   ____________________________________________   LABS (all labs ordered are listed, but only abnormal results are  displayed)  Labs Reviewed  BASIC METABOLIC PANEL - Abnormal; Notable for the following:       Result Value   Potassium 3.3 (*)    Glucose, Bld 102 (*)    Calcium 8.7 (*)    All other components within normal limits  CBC - Abnormal; Notable for the following:    MCV 79.7 (*)    All other components within normal limits  TROPONIN I  TROPONIN I   ____________________________________________  EKG  ED ECG REPORT I, Rebecka ApleyWebster,  Allison P, the attending physician, personally  viewed and interpreted this ECG.   Date: 03/01/2017  EKG Time: 2333  Rate: 65  Rhythm: normal sinus rhythm  Axis: normal  Intervals:none  ST&T Change: flipped t waves in lead III  ____________________________________________  RADIOLOGY  Dg Chest 2 View  Result Date: 03/02/2017 CLINICAL DATA:  Left-sided chest pain radiating to the back. Elevated blood pressure. History of hypertension. EXAM: CHEST  2 VIEW COMPARISON:  03/13/2014 FINDINGS: Mild hyperinflation. No focal airspace disease or consolidation in the lungs. No blunting of costophrenic angles. No pneumothorax. Calcified and tortuous aorta. Degenerative changes in the spine and shoulders. Calcifications in the left upper quadrant correspond to calcified splenic granulomas on previous CT from 07/11/2006. IMPRESSION: Hyperinflation. No evidence of active pulmonary disease. Aortic atherosclerosis. Left upper quadrant calcifications consistent with calcified splenic granulomas. Electronically Signed   By: Burman NievesWilliam  Stevens M.D.   On: 03/02/2017 00:01    ____________________________________________   PROCEDURES  Procedure(s) performed: None  Procedures  Critical Care performed: No  ____________________________________________   INITIAL IMPRESSION / ASSESSMENT AND PLAN / ED COURSE  Pertinent labs & imaging results that were available during my care of the patient were reviewed by me and considered in my medical decision making (see chart for details).  This is an 83 year old female who comes into the hospital today with some chest pain and high blood pressure. The patient's chest pain is improved but her blood pressure still high. The patient has not yet taken her nighttime medications. I did give the patient a dose of metoprolol. Her blood pressure came down a small amount but I did give her a dose of hydralazine as well. I repeated the patient's troponin into the chest x-ray. The patient's blood work is unremarkable including  her repeat troponin. I will discharge the patient to have her follow-up with her primary care physician. Although she's never had this pain before it is not typical chest pain. The symptoms did improve. She may have had a little hypertensive urgency but the pain resolved prior to my evaluation and prior to bringing down her blood pressure. The patient should follow-up with her primary care doctor.      ____________________________________________   FINAL CLINICAL IMPRESSION(S) / ED DIAGNOSES  Final diagnoses:  Chest pain, unspecified type  Hypertension, unspecified type      NEW MEDICATIONS STARTED DURING THIS VISIT:  There are no discharge medications for this patient.    Note:  This document was prepared using Dragon voice recognition software and may include unintentional dictation errors.    Rebecka ApleyWebster, Allison P, MD 03/02/17 [phone]

## 2017-03-02 NOTE — Discharge Instructions (Signed)
Please follow up with your primary care physician.

## 2017-03-18 ENCOUNTER — Emergency Department: Payer: Medicare HMO

## 2017-03-18 ENCOUNTER — Emergency Department
Admission: EM | Admit: 2017-03-18 | Discharge: 2017-03-18 | Disposition: A | Discharge status: Home / Self Care | Payer: Medicare HMO | Attending: Emergency Medicine | Admitting: Emergency Medicine

## 2017-03-18 ENCOUNTER — Encounter: Payer: Self-pay | Admitting: Emergency Medicine

## 2017-03-18 DIAGNOSIS — S59912A Unspecified injury of left forearm, initial encounter: Secondary | ICD-10-CM | POA: Diagnosis present

## 2017-03-18 DIAGNOSIS — Y929 Unspecified place or not applicable: Secondary | ICD-10-CM | POA: Diagnosis not present

## 2017-03-18 DIAGNOSIS — S52502A Unspecified fracture of the lower end of left radius, initial encounter for closed fracture: Secondary | ICD-10-CM | POA: Insufficient documentation

## 2017-03-18 DIAGNOSIS — S0990XA Unspecified injury of head, initial encounter: Secondary | ICD-10-CM | POA: Insufficient documentation

## 2017-03-18 DIAGNOSIS — I10 Essential (primary) hypertension: Secondary | ICD-10-CM | POA: Diagnosis not present

## 2017-03-18 DIAGNOSIS — Y9301 Activity, walking, marching and hiking: Secondary | ICD-10-CM | POA: Diagnosis not present

## 2017-03-18 DIAGNOSIS — Y999 Unspecified external cause status: Secondary | ICD-10-CM | POA: Diagnosis not present

## 2017-03-18 DIAGNOSIS — W19XXXA Unspecified fall, initial encounter: Secondary | ICD-10-CM

## 2017-03-18 DIAGNOSIS — W108XXA Fall (on) (from) other stairs and steps, initial encounter: Secondary | ICD-10-CM | POA: Insufficient documentation

## 2017-03-18 MED ORDER — TRAMADOL HCL 50 MG PO TABS: 50.0000 mg | ORAL_TABLET | Freq: Four times a day (QID) | ORAL | 0 refills | Status: AC | PRN

## 2017-03-18 NOTE — Discharge Instructions (Signed)
Please seek medical attention for any high fevers, chest pain, shortness of breath, change in behavior, persistent vomiting, bloody stool or any other new or concerning symptoms.  

## 2017-03-18 NOTE — ED Notes (Signed)

## 2017-03-18 NOTE — ED Provider Notes (Signed)
[facility] Emergency Department Provider Note   ____________________________________________   I have reviewed the triage vital signs and the nursing notes.   HISTORY  Chief Complaint Fall and Arm Pain   History limited by: Not Limited   HPI Toni Perry is a 83 y.o. female who presents to the emergency department today because of concern for continued headache and left arm pain after a fall. The fall occurred two days ago. It occurred when the patient fell down a couple of steps. She states that she turned the wrong way on them. Hit her head and hurt her left arm at that time. Pain has been accompanied by swelling and bruising of her left arm. She denies any chest pain or shortness of breath. Denies any recent illness.    Past Medical History:  Diagnosis Date  . Hypertension     There are no active problems to display for this patient.   No past surgical history on file.  Prior to Admission medications   Not on File    Allergies Patient has no known allergies.  No family history on file.  Social History Social History  Substance Use Topics  . Smoking status: Never Smoker  . Smokeless tobacco: Never Used  . Alcohol use No    Review of Systems Constitutional: No fever/chills Eyes: No visual changes. ENT: No sore throat. Cardiovascular: Denies chest pain. Respiratory: Denies shortness of breath. Gastrointestinal: No abdominal pain.  No nausea, no vomiting.  No diarrhea.   Genitourinary: Negative for dysuria. Musculoskeletal: Positive for left arm pain. Skin: Positive for bruising over her left arm.  Neurological: Positive for headache.  ____________________________________________   PHYSICAL EXAM:  VITAL SIGNS: ED Triage Vitals  Enc Vitals Group     BP 03/18/17 1443 127/68     Pulse Rate 03/18/17 1443 73     Resp 03/18/17 1443 18     Temp 03/18/17 1443 98.1 F (36.7 C)     Temp Source 03/18/17 1443 Oral     SpO2  03/18/17 1443 96 %     Weight 03/18/17 1444 154 lb (69.9 kg)     Height 03/18/17 1444 5' 5.5" (1.664 m)     Head Circumference --      Peak Flow --      Pain Score 03/18/17 1443 10   Constitutional: Alert and oriented. Well appearing and in no distress. Eyes: Conjunctivae are normal.  ENT   Head: Normocephalic and atraumatic.   Nose: No congestion/rhinnorhea.   Mouth/Throat: Mucous membranes are moist.   Neck: No stridor. Hematological/Lymphatic/Immunilogical: No cervical lymphadenopathy. Cardiovascular: Normal rate, regular rhythm.  No murmurs, rubs, or gallops.  Respiratory: Normal respiratory effort without tachypnea nor retractions. Breath sounds are clear and equal bilaterally. No wheezes/rales/rhonchi. Gastrointestinal: Soft and non tender. No rebound. No guarding.  Genitourinary: Deferred Musculoskeletal: Left arm and wrist with swelling and ecchymosis. Tender to palpation and manipulation of the patients left wrist.  Neurologic:  Normal speech and language. No gross focal neurologic deficits are appreciated.  Skin:  Skin is warm, dry and intact. No rash noted. Psychiatric: Mood and affect are normal. Speech and behavior are normal. Patient exhibits appropriate insight and judgment.  ____________________________________________    LABS (pertinent positives/negatives)  None  ____________________________________________   EKG  None  ____________________________________________    RADIOLOGY  CT head IMPRESSION: No acute abnormality.  No change in mild atrophy and chronic microvascular ischemic change.  Atherosclerosis.  Left forearm IMPRESSION: Comminuted impacted fracture of  the distal radius which may be better evaluated with wrist radiography.   ____________________________________________   PROCEDURES  Procedures  POST SPLINT CHECK Left forearm splint applied by tech.  Good position.  Distally N/V intact, sensation intact. No  discoloration.  ____________________________________________   INITIAL IMPRESSION / ASSESSMENT AND PLAN / ED COURSE  Pertinent labs & imaging results that were available during my care of the patient were reviewed by me and considered in my medical decision making (see chart for details).  Patient presented to the emergency department today with concerns for headache and the left arm pain after a fall 2 days ago. CT head negative. Patient does have a distal radial fracture. Patient was splinted. Will have patient follow-up with orthopedics.  ____________________________________________   FINAL CLINICAL IMPRESSION(S) / ED DIAGNOSES  Final diagnoses:  Fall, initial encounter  Closed fracture of distal end of left radius, unspecified fracture morphology, initial encounter     Note: This dictation was prepared with Dragon dictation. Any transcriptional errors that result from this process are unintentional     Phineas Semen, MD 03/18/17 [phone]

## 2017-03-18 NOTE — ED Triage Notes (Signed)
Pt reports fell two days ago while walking down a step. Denies dizziness prior to fall. Pt reports hit head, denies LOC. Pt presents with left arm swelling, bruising and pain. Strong left radial pulses palpated.

## 2017-07-05 ENCOUNTER — Other Ambulatory Visit: Payer: Self-pay | Admitting: Family Medicine

## 2017-07-05 DIAGNOSIS — Z780 Asymptomatic menopausal state: Secondary | ICD-10-CM

## 2017-09-14 IMAGING — CT CT HEAD W/O CM
3 series · 16 of 47 positions shown, 19 images · non-contrast
Comparison: Head CT scan 09/08/2016.

CLINICAL DATA: Status post fall 2 days ago on steps with a blow to
the head.

EXAM:
CT HEAD WITHOUT CONTRAST
TECHNIQUE: Contiguous axial images were obtained from the base of the skull
through the vertex without intravenous contrast.

[Series 3: head wo · axial · 0.41mm/px · z∈[-174,-44]mm · 10 of 32 slices shown, 13 images]
[im 3/32  brain]
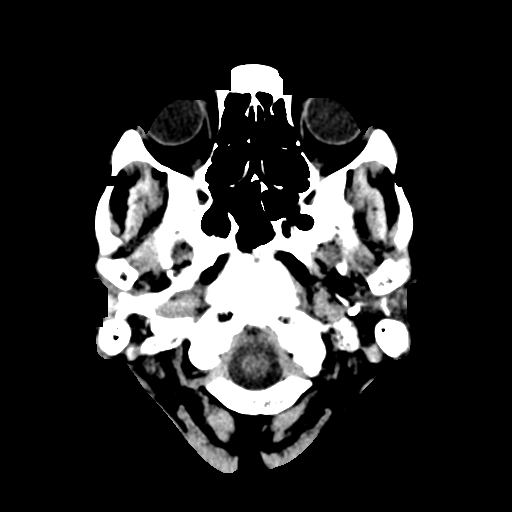
[im 3/32  bone]
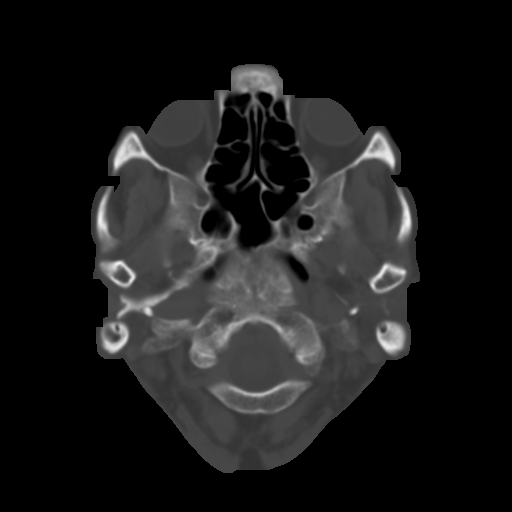
[im 6/32  brain]
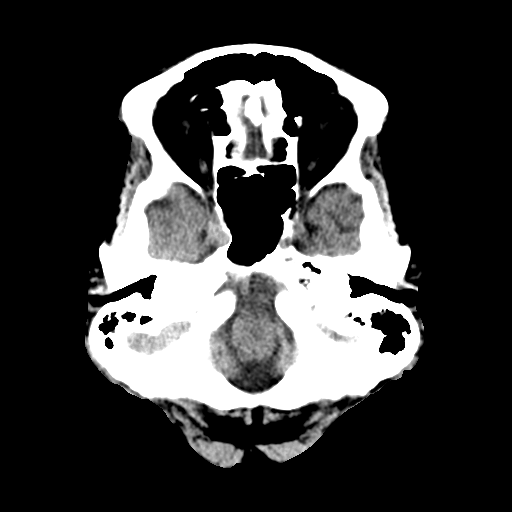
[im 9/32  brain]
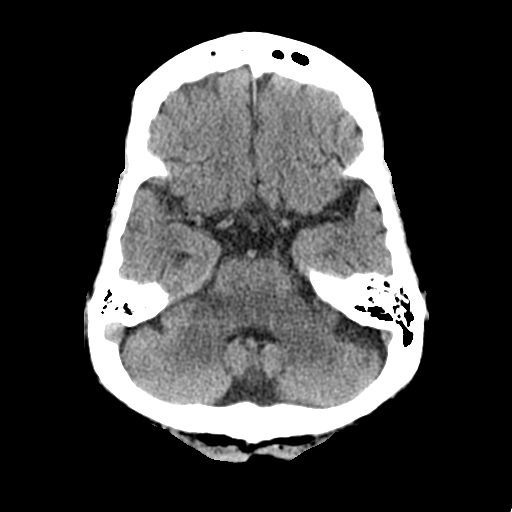
[im 11/32  brain]
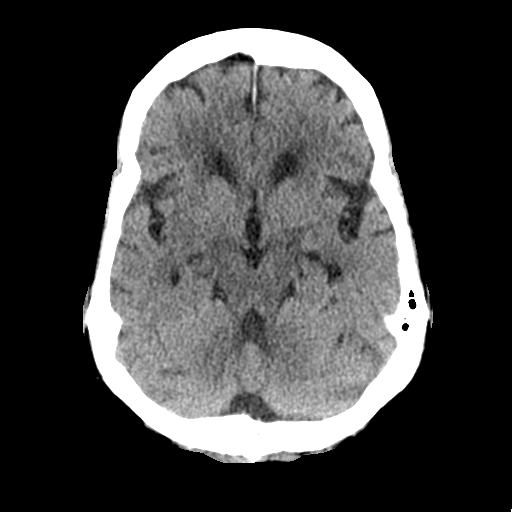
[im 14/32  brain]
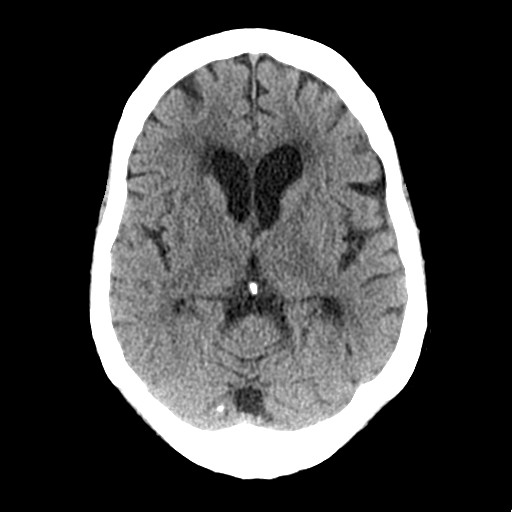
[im 14/32  bone]
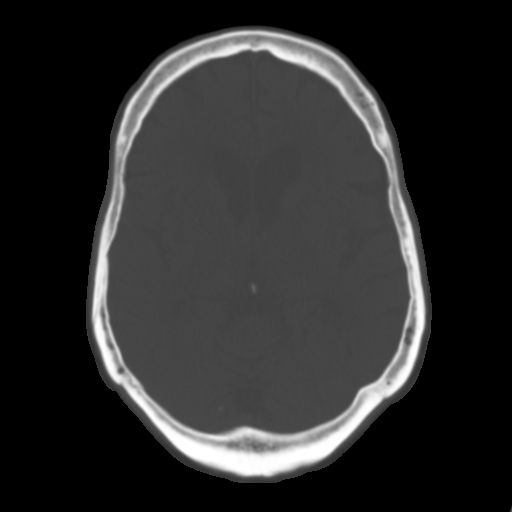
[im 18/32  brain]
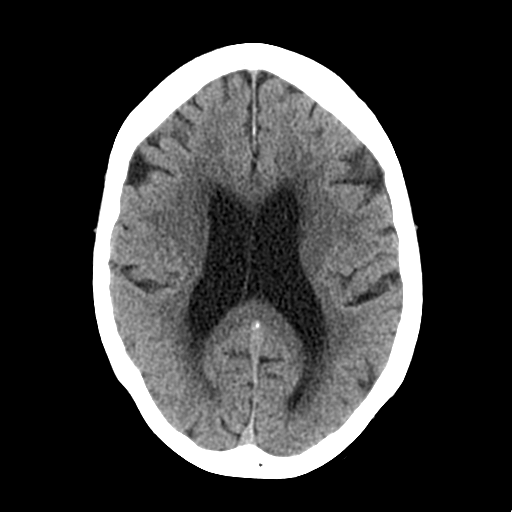
[im 21/32  brain]
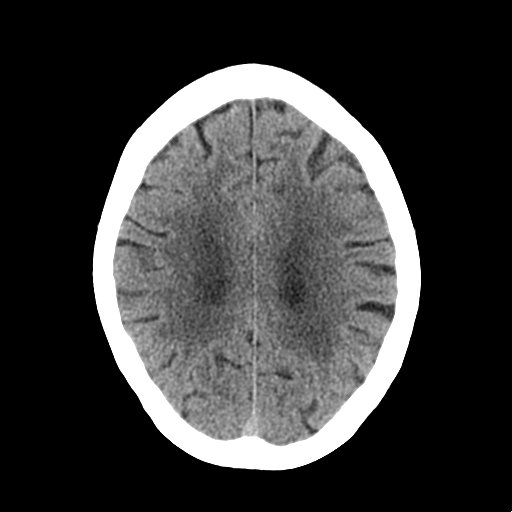
[im 24/32  brain]
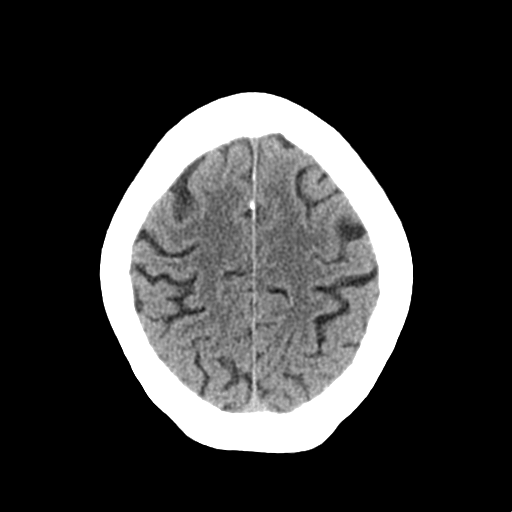
[im 26/32  brain]
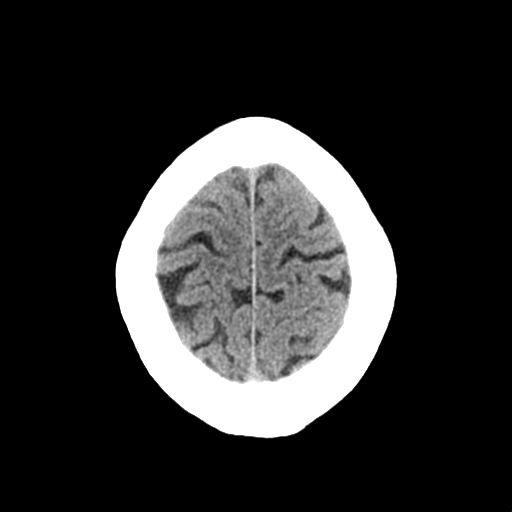
[im 26/32  bone]
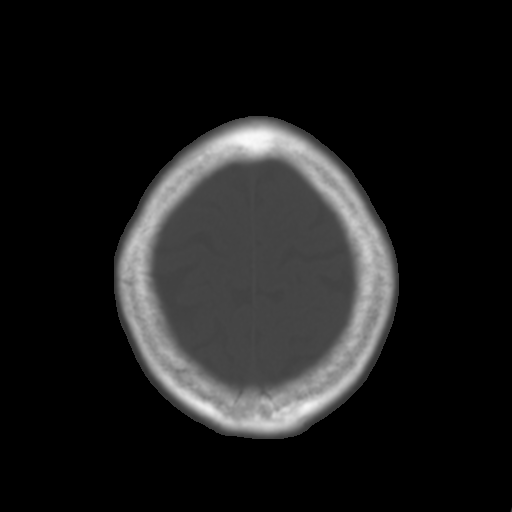
[im 29/32  brain]
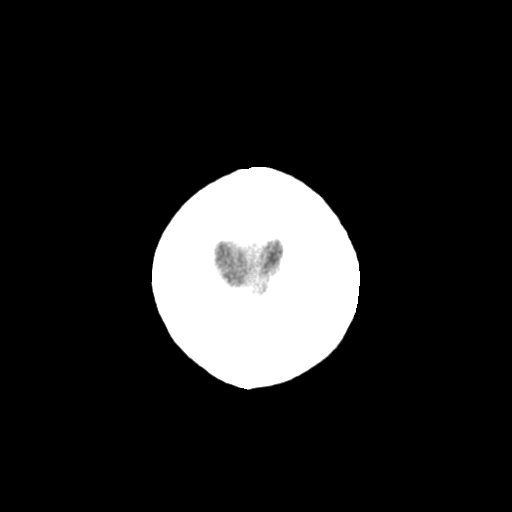

[Series 4: coronal soft tissue · coronal · 0.31mm/px · 3 of 66 slices shown]
[im 22/66  brain]
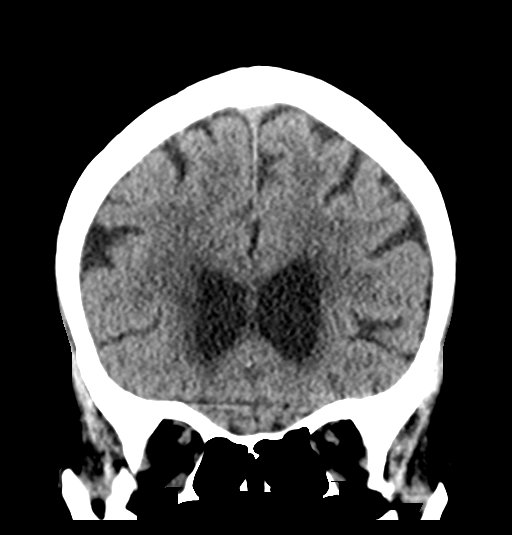
[im 29/66  brain]
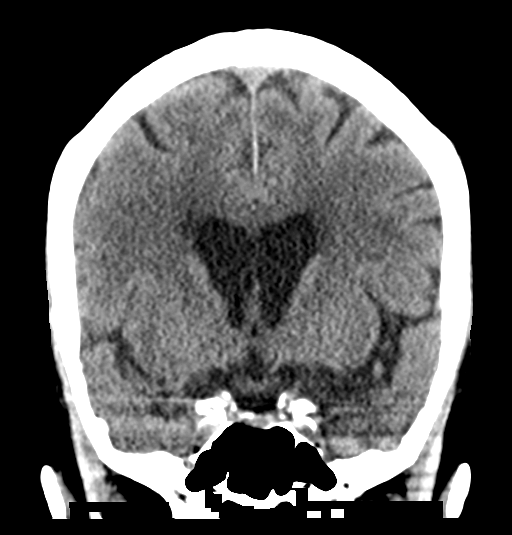
[im 37/66  brain]
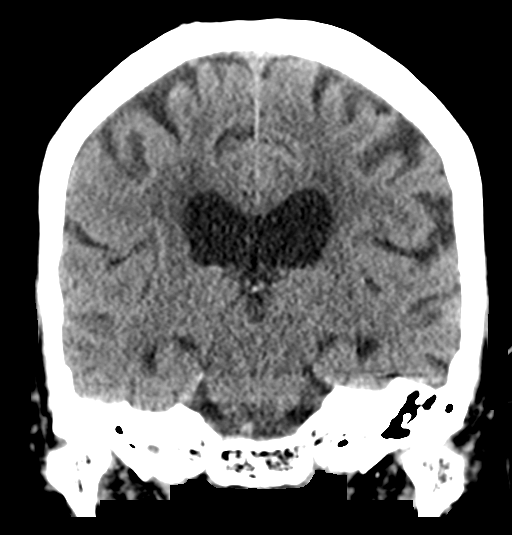

[Series 5: sagittal soft tissue · sagittal · 0.32mm/px · 3 of 64 slices shown]
[im 22/64  brain]
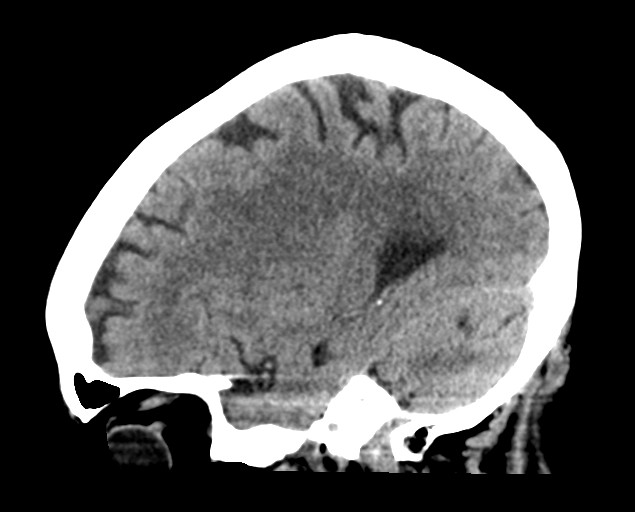
[im 32/64  brain]
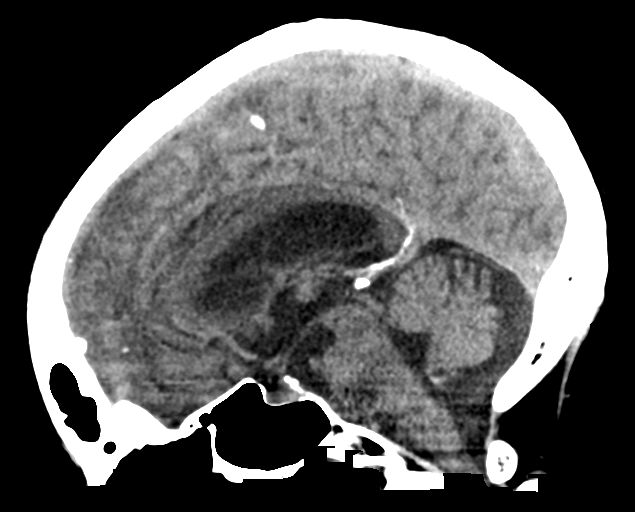
[im 43/64  brain]
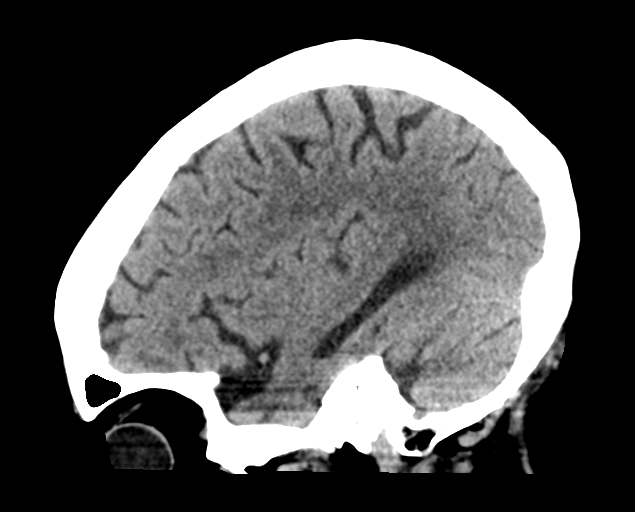

[16 of 47 positions shown; findings below may reference images not displayed]

FINDINGS: Brain: There is some cortical atrophy and chronic microvascular
ischemic change. No evidence of acute abnormality including
hemorrhage, infarct, mass lesion, mass effect, midline shift or
abnormal extra-axial fluid collection. No hydrocephalus or
pneumocephalus.

Vascular: Atherosclerosis noted.

Skull: Intact.

Sinuses/Orbits: Negative.

Other: None.
IMPRESSION: No acute abnormality.

No change in mild atrophy and chronic microvascular ischemic change.

Atherosclerosis.

## 2021-05-29 DIAGNOSIS — T68XXXA Hypothermia, initial encounter: Secondary | ICD-10-CM | POA: Diagnosis not present

## 2021-05-29 DIAGNOSIS — R001 Bradycardia, unspecified: Secondary | ICD-10-CM | POA: Diagnosis not present

## 2021-05-29 DIAGNOSIS — E039 Hypothyroidism, unspecified: Secondary | ICD-10-CM | POA: Diagnosis not present

## 2021-05-30 DIAGNOSIS — E039 Hypothyroidism, unspecified: Secondary | ICD-10-CM | POA: Diagnosis not present

## 2021-05-30 DIAGNOSIS — T68XXXA Hypothermia, initial encounter: Secondary | ICD-10-CM | POA: Diagnosis not present

## 2021-05-30 DIAGNOSIS — R001 Bradycardia, unspecified: Secondary | ICD-10-CM | POA: Diagnosis not present

## 2021-07-03 DEATH — deceased
